# Patient Record
Sex: Female | Born: 2000 | Race: Black or African American | Hispanic: No | Marital: Single | Smoking: Never smoker
Health system: Southern US, Community
[De-identification: ages and names within clinical notes are randomized; demographics above are authoritative.]

## PROBLEM LIST (undated history)

## (undated) HISTORY — PX: WISDOM TOOTH EXTRACTION: SHX21

---
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
## 2022-01-10 ENCOUNTER — Other Ambulatory Visit: Payer: Self-pay

## 2022-01-10 ENCOUNTER — Emergency Department (HOSPITAL_COMMUNITY)
Admission: EM | Admit: 2022-01-10 | Discharge: 2022-01-10 | Disposition: A | Discharge status: Home / Self Care | Payer: BC Managed Care – PPO | Attending: Emergency Medicine | Admitting: Emergency Medicine

## 2022-01-10 ENCOUNTER — Encounter (HOSPITAL_COMMUNITY): Payer: Self-pay | Admitting: Emergency Medicine

## 2022-01-10 ENCOUNTER — Emergency Department (HOSPITAL_COMMUNITY): Payer: BC Managed Care – PPO

## 2022-01-10 DIAGNOSIS — Z7722 Contact with and (suspected) exposure to environmental tobacco smoke (acute) (chronic): Secondary | ICD-10-CM | POA: Diagnosis not present

## 2022-01-10 DIAGNOSIS — S43402A Unspecified sprain of left shoulder joint, initial encounter: Secondary | ICD-10-CM | POA: Diagnosis not present

## 2022-01-10 DIAGNOSIS — M25512 Pain in left shoulder: Secondary | ICD-10-CM | POA: Insufficient documentation

## 2022-01-10 DIAGNOSIS — M542 Cervicalgia: Secondary | ICD-10-CM | POA: Diagnosis not present

## 2022-01-10 DIAGNOSIS — R519 Headache, unspecified: Secondary | ICD-10-CM | POA: Diagnosis not present

## 2022-01-10 LAB — POC URINE PREG, ED: Preg Test, Ur: NEGATIVE

## 2022-01-10 MED ORDER — ACETAMINOPHEN 325 MG PO TABS: 650.0000 mg | ORAL_TABLET | Freq: Four times a day (QID) | ORAL | 0 refills | Status: AC | PRN

## 2022-01-10 MED ORDER — IBUPROFEN 600 MG PO TABS: 600.0000 mg | ORAL_TABLET | Freq: Four times a day (QID) | ORAL | 0 refills | Status: DC | PRN

## 2022-01-10 MED ORDER — METHOCARBAMOL 500 MG PO TABS: 1000.0000 mg | ORAL_TABLET | Freq: Two times a day (BID) | ORAL | 0 refills | Status: AC

## 2022-01-10 NOTE — Discharge Instructions (Signed)
It was a pleasure caring for you today in the emergency department. ° °Please return to the emergency department for any worsening or worrisome symptoms. ° ° °
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
## 2022-02-13 IMAGING — DX DG CHEST 2V
2 series · 2 of 2 positions shown · non-contrast
Comparison: None.

CLINICAL DATA: Cough and shortness of breath

EXAM:
CHEST - 2 VIEW

[chest pa]
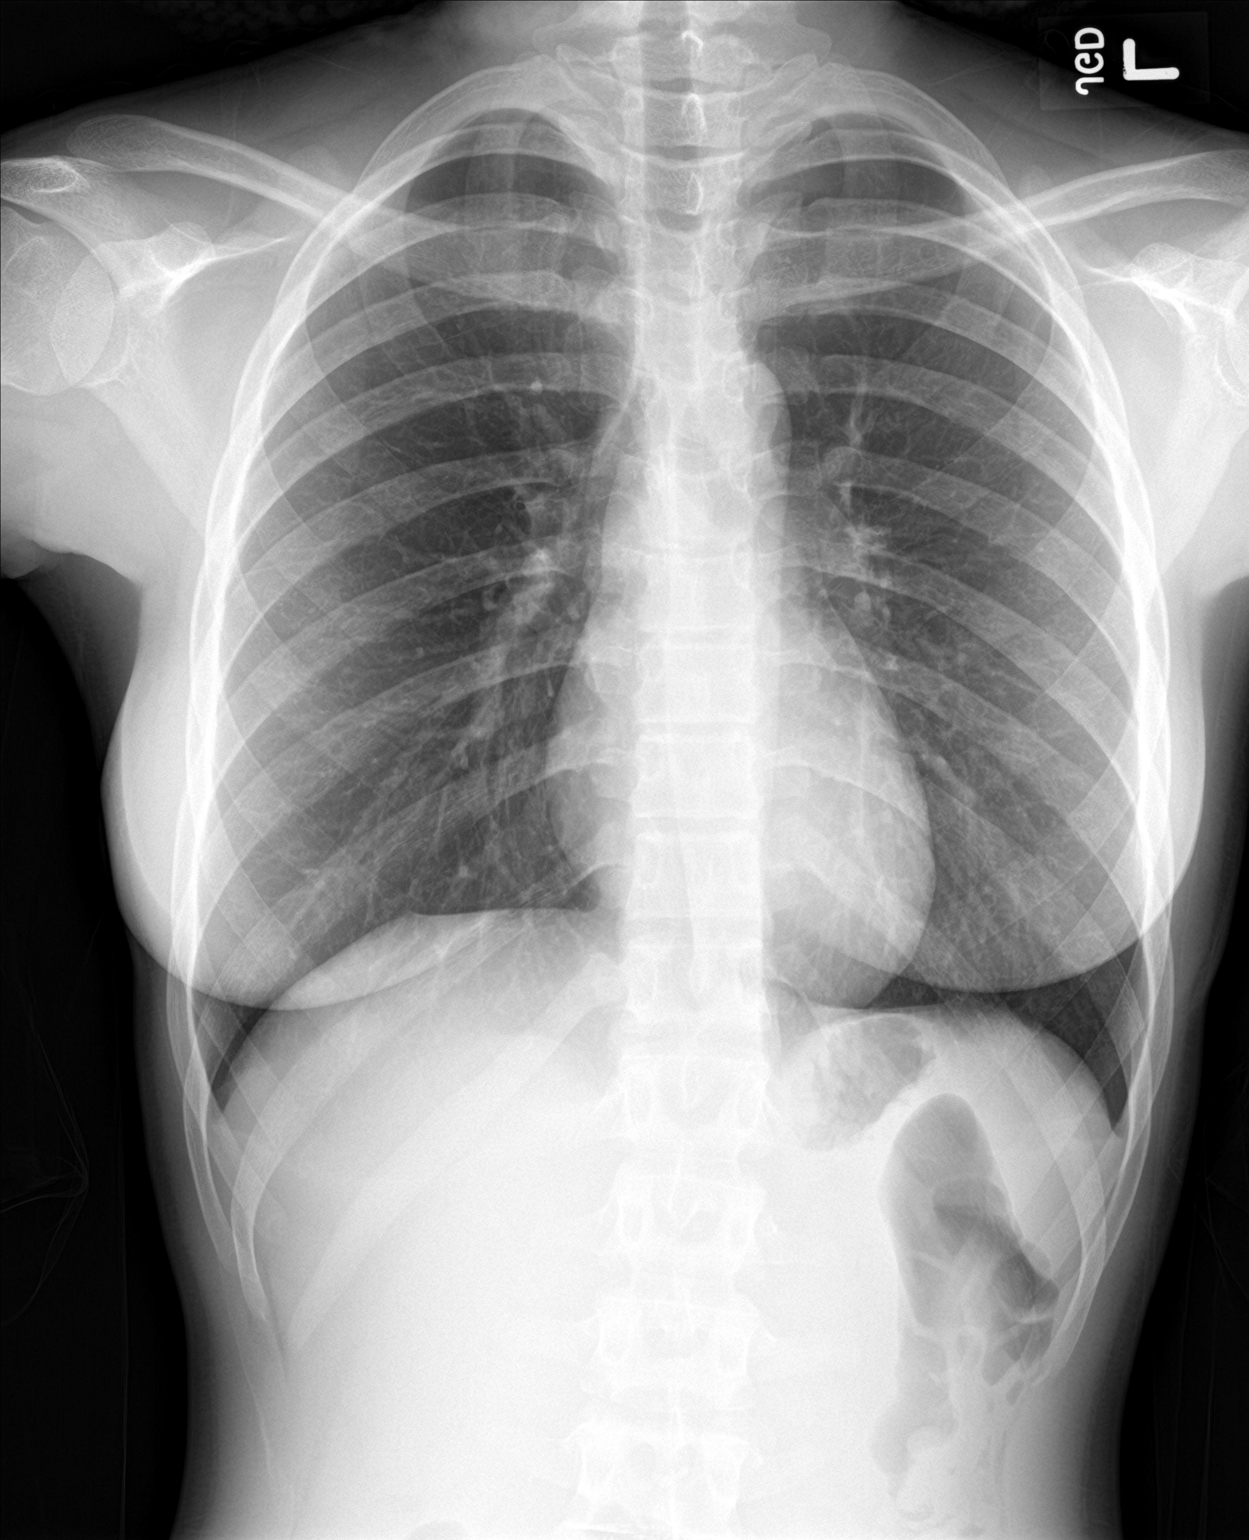

[chest lat]
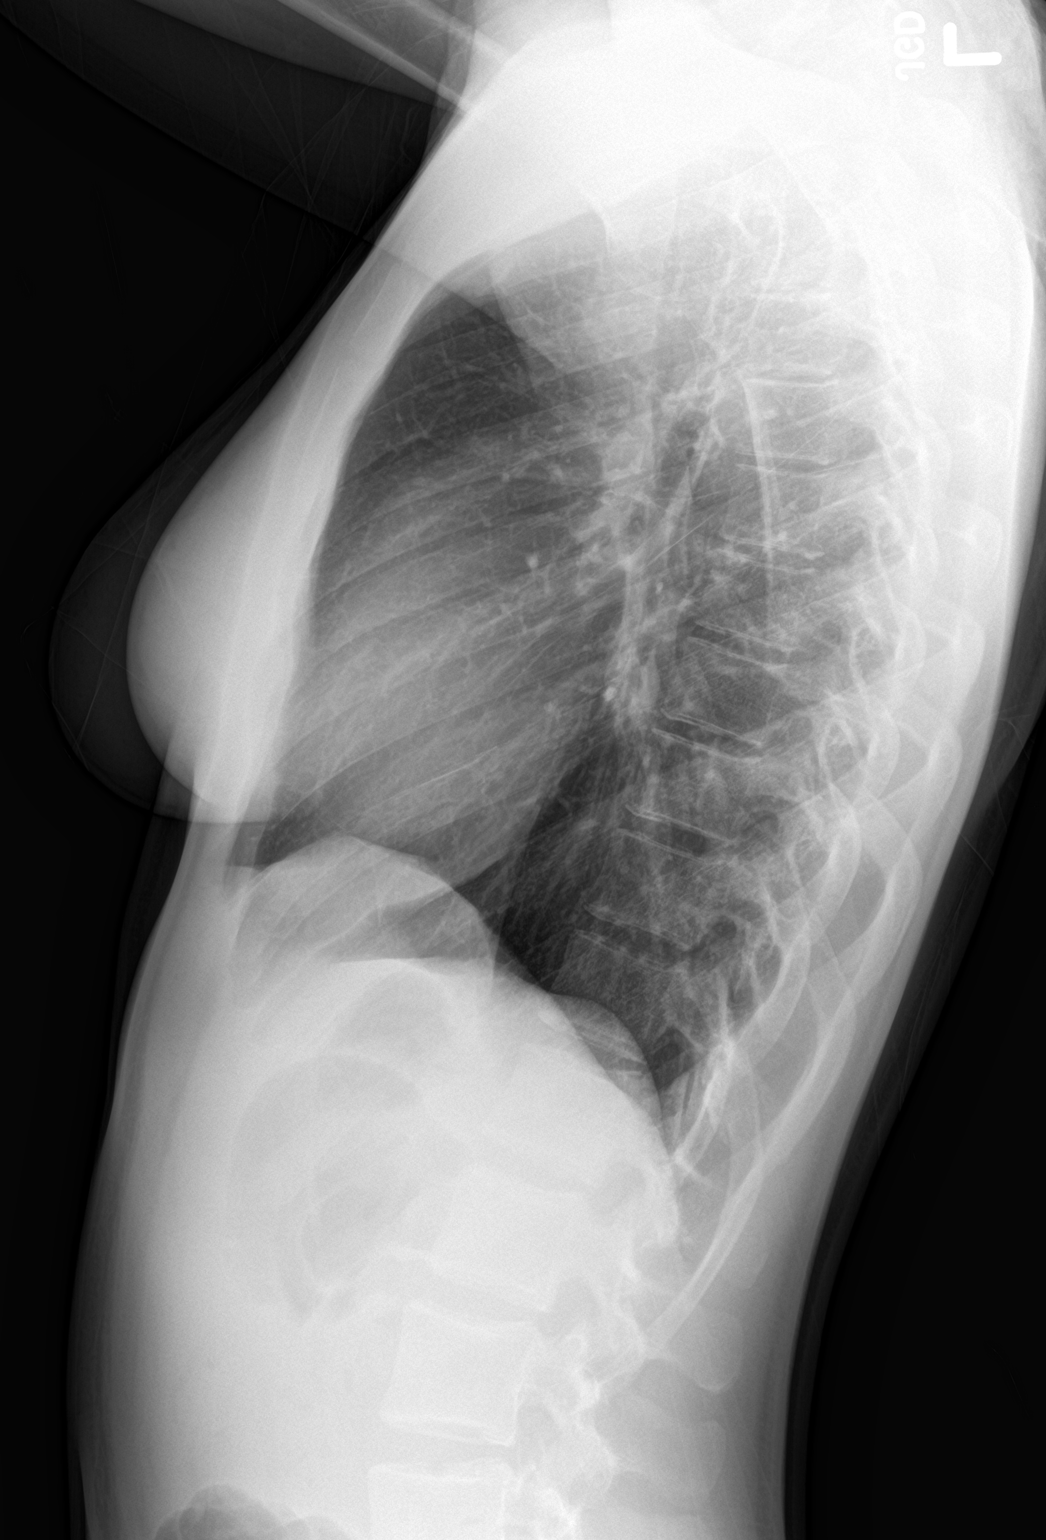

[2 of 2 positions shown; findings below may reference images not displayed]

FINDINGS: The heart size and mediastinal contours are within normal limits.
Both lungs are clear. The visualized skeletal structures are
unremarkable.
IMPRESSION: No active cardiopulmonary disease.

## 2022-03-03 ENCOUNTER — Ambulatory Visit (INDEPENDENT_AMBULATORY_CARE_PROVIDER_SITE_OTHER): Payer: BC Managed Care – PPO

## 2022-03-03 ENCOUNTER — Ambulatory Visit (HOSPITAL_COMMUNITY)
Admission: EM | Admit: 2022-03-03 | Discharge: 2022-03-03 | Disposition: A | Discharge status: Home / Self Care | Payer: BC Managed Care – PPO | Attending: Internal Medicine | Admitting: Internal Medicine

## 2022-03-03 ENCOUNTER — Encounter (HOSPITAL_COMMUNITY): Payer: Self-pay

## 2022-03-03 DIAGNOSIS — M545 Low back pain, unspecified: Secondary | ICD-10-CM | POA: Diagnosis not present

## 2022-03-03 DIAGNOSIS — S161XXA Strain of muscle, fascia and tendon at neck level, initial encounter: Secondary | ICD-10-CM | POA: Diagnosis not present

## 2022-03-03 DIAGNOSIS — M542 Cervicalgia: Secondary | ICD-10-CM | POA: Diagnosis not present

## 2022-03-03 LAB — POC URINE PREG, ED: Preg Test, Ur: NEGATIVE

## 2022-03-03 MED ORDER — METHOCARBAMOL 500 MG PO TABS: 500.0000 mg | ORAL_TABLET | Freq: Two times a day (BID) | ORAL | 0 refills | Status: DC
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
## 2022-03-03 NOTE — Discharge Instructions (Signed)
You have been evaluated in the today for your back and neck pain. Your pain is most likely muscle strain which will improve on its own with time.   You may continue taking ibuprofen 600 mg every 6 hours as needed for pain and inflammation.  You may also take Robaxin muscle relaxer every 12 hours as needed for muscle spasm.  Do not take this medication and drive or drink alcohol as it can make you sleepy.  Mainly use this medicine at nighttime as needed.  Apply heat and perform gentle range of motion exercises to the area of greatest pain to prevent muscle stiffness and provide further pain relief.   Follow-up with your primary care provider or return to urgent care if your symptoms do not improve in the next 3 to 4 days with medications and interventions recommended today.  If you develop any new or worsening symptoms, please return to urgent care.  If your symptoms are severe, please go to the emergency room.  I hope you feel better!
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
## 2022-07-31 ENCOUNTER — Emergency Department (HOSPITAL_COMMUNITY): Payer: BC Managed Care – PPO

## 2022-07-31 ENCOUNTER — Emergency Department (HOSPITAL_COMMUNITY)
Admission: EM | Admit: 2022-07-31 | Discharge: 2022-07-31 | Disposition: A | Discharge status: Home / Self Care | Payer: BC Managed Care – PPO | Attending: Emergency Medicine | Admitting: Emergency Medicine

## 2022-07-31 ENCOUNTER — Encounter (HOSPITAL_COMMUNITY): Payer: Self-pay

## 2022-07-31 ENCOUNTER — Other Ambulatory Visit: Payer: Self-pay

## 2022-07-31 DIAGNOSIS — M25512 Pain in left shoulder: Secondary | ICD-10-CM | POA: Diagnosis not present

## 2022-07-31 LAB — I-STAT BETA HCG BLOOD, ED (MC, WL, AP ONLY): I-stat hCG, quantitative: <5 m[IU]/mL (ref <5)

## 2022-07-31 MED ORDER — METHOCARBAMOL 500 MG PO TABS: 500.0000 mg | ORAL_TABLET | Freq: Two times a day (BID) | ORAL | 0 refills | Status: DC

## 2022-07-31 MED ORDER — IBUPROFEN 400 MG PO TABS: 400.0000 mg | ORAL_TABLET | Freq: Three times a day (TID) | ORAL | 0 refills | Status: AC

## 2022-07-31 NOTE — Discharge Instructions (Signed)
Your evaluation today has been largely reassuring.  But, it is important that you monitor your condition carefully, and do not hesitate to return to the ED if you develop new, or concerning changes in your condition.  Otherwise, please follow-up with our orthopedic physician for appropriate ongoing care.
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
## 2022-08-25 ENCOUNTER — Ambulatory Visit (HOSPITAL_COMMUNITY)
Admission: EM | Admit: 2022-08-25 | Discharge: 2022-08-25 | Disposition: A | Discharge status: Home / Self Care | Payer: BC Managed Care – PPO | Attending: Family Medicine | Admitting: Family Medicine

## 2022-08-25 ENCOUNTER — Other Ambulatory Visit: Payer: Self-pay

## 2022-08-25 ENCOUNTER — Encounter (HOSPITAL_COMMUNITY): Payer: Self-pay | Admitting: *Deleted

## 2022-08-25 DIAGNOSIS — K529 Noninfective gastroenteritis and colitis, unspecified: Secondary | ICD-10-CM

## 2022-08-25 MED ORDER — ONDANSETRON 4 MG PO TBDP: 4.0000 mg | ORAL_TABLET | Freq: Three times a day (TID) | ORAL | 0 refills | Status: DC | PRN

## 2022-08-25 MED ORDER — FAMOTIDINE 40 MG PO TABS: 40.0000 mg | ORAL_TABLET | Freq: Every day | ORAL | 0 refills | Status: AC

## 2022-08-25 NOTE — ED Provider Notes (Signed)
[facility]    CSN: [id number] Arrival date & time: 08/25/22  1601      History   Chief Complaint Chief Complaint  Patient presents with   Headache   Emesis   Diarrhea    HPI Krista Bailey is a 22 y.o. female.   HPI Patient here for work note. She reports over the last 6 days experiencing nausea, vomiting, and diarrhea. She has missed work due to symptoms. Reports no overt abdominal pain although endorses "feeling sick on the stomach". She ate today and reports tolerated food. She denies to this writer that she vomited today, although endorses ongoing nausea.Patient is a chronic cannabis user and patient has had prior encounters for similar symptoms. She has not taken any medication for her symptoms.  History reviewed. No pertinent past medical history.  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 No pertinent surgical history.  OB History   No obstetric history on file.      Home Medications    Prior to Admission medications   Medication Sig Start Date End Date Taking? Authorizing Provider  famotidine (PEPCID) 40 MG tablet Take 1 tablet (40 mg total) by mouth daily. 08/25/22  Yes Bing Neighbors, NP  ondansetron (ZOFRAN-ODT) 4 MG disintegrating tablet Take 1 tablet (4 mg total) by mouth every 8 (eight) hours as needed for nausea or vomiting. 08/25/22  Yes Bing Neighbors, NP  acetaminophen (TYLENOL) 325 MG tablet Take 2 tablets (650 mg total) by mouth every 6 (six) hours as needed. 01/10/22   Sloan Leiter, DO  ibuprofen (ADVIL) 600 MG tablet Take 1 tablet (600 mg total) by mouth every 6 (six) hours as needed. 01/10/22   Sloan Leiter, DO  methocarbamol (ROBAXIN) 500 MG tablet Take 1 tablet  (500 mg total) by mouth 2 (two) times daily. 07/31/22   Gerhard Munch, MD    Family History Family History  Problem Relation Age of Onset   Healthy Mother    Healthy Father     Social History Social History   Tobacco Use   Smoking status: Never    Passive exposure: Yes   Smokeless tobacco: Never  Substance Use Topics   Alcohol use: Never   Drug use: Not Currently    Types: Marijuana     Allergies   Patient has no known allergies.   Review of Systems Review of Systems Pertinent negatives listed in HPI   Physical Exam Triage Vital Signs ED Triage Vitals  Enc Vitals Group     BP      Pulse      Resp      Temp      Temp src      SpO2      Weight      Height      Head Circumference      Peak Flow      Pain Score      Pain Loc      Pain Edu?      Excl. in GC?    No data found.  Updated Vital Signs BP 108/67   Pulse 67   Temp 98.7 F (37.1 C)   Resp 18   LMP 05/29/2022 (Approximate) Comment: Depo shot  SpO2 98%   Visual Acuity Right Eye Distance:   Left Eye Distance:   Bilateral Distance:    Right Eye Near:   Left Eye Near:    Bilateral Near:     Physical Exam General appearance: Alert, well developed, well nourished, cooperative  Head: Normocephalic, without obvious abnormality, atraumatic Heart: Rate and rhythm normal.  Respiratory: Respirations even and unlabored, normal respiratory rate Abdomen: BS +, no distention, no rebound tenderness, or no mass Extremities: No gross deformities Neurologic:  Skin: Skin color, texture, turgor normal. No rashes seen  Psych: Appropriate mood and affect.  UC Treatments / Results  Labs (all labs ordered are listed, but only abnormal results are displayed) Labs Reviewed - No data to display   EKG   Radiology No results found.  Procedures Procedures (including critical care time)  Medications Ordered in UC Medications - No data to display  Initial Impression / Assessment and Plan / UC  Course  I have reviewed the triage vital signs and the nursing notes.  Pertinent labs & imaging results that were available during my care of the patient were reviewed by me and considered in my medical decision making (see chart for details).    Low suspicion for acute abdomen, patient is grossly well-appearing. Symptom management only and bland diet. Strict ED precautions given if symptoms worsen.  Work note provided. Final Clinical Impressions(s) / UC Diagnoses     Discharge Instructions   None    ED Prescriptions     Medication Sig Dispense Auth. Provider   ondansetron (ZOFRAN-ODT) 4 MG disintegrating tablet Take 1 tablet (4 mg total) by mouth every 8 (eight) hours as needed for nausea or vomiting. 20 tablet Bing Neighbors, NP   famotidine (PEPCID) 40 MG tablet Take 1 tablet (40 mg total) by mouth daily. 30 tablet Bing Neighbors, NP      PDMP not reviewed this encounter.   Bing Neighbors, NP 08/29/22 2222

## 2022-08-25 NOTE — ED Triage Notes (Addendum)
Pt reports she had food poisioning  with HA ,vomiting and diarrhea last WEd. Pt has had diarrhea and vomiting while waiting for a room today. Pt needs a work note.

## 2022-09-03 ENCOUNTER — Ambulatory Visit (HOSPITAL_COMMUNITY)
Admission: EM | Admit: 2022-09-03 | Discharge: 2022-09-03 | Disposition: A | Discharge status: Home / Self Care | Payer: BC Managed Care – PPO | Attending: Nurse Practitioner | Admitting: Nurse Practitioner

## 2022-09-03 ENCOUNTER — Encounter (HOSPITAL_COMMUNITY): Payer: Self-pay | Admitting: Emergency Medicine

## 2022-09-03 DIAGNOSIS — R220 Localized swelling, mass and lump, head: Secondary | ICD-10-CM | POA: Diagnosis not present

## 2022-09-03 DIAGNOSIS — L03211 Cellulitis of face: Secondary | ICD-10-CM

## 2022-09-03 DIAGNOSIS — Z789 Other specified health status: Secondary | ICD-10-CM

## 2022-09-03 MED ORDER — DOXYCYCLINE HYCLATE 100 MG PO CAPS: 100.0000 mg | ORAL_CAPSULE | Freq: Two times a day (BID) | ORAL | 0 refills | Status: AC

## 2022-09-03 NOTE — ED Triage Notes (Signed)
Pt presents with right eye swelling. Pt recently got new facial piecings placed and now has swelling around the upper and lower eye lid.

## 2022-09-03 NOTE — ED Provider Notes (Signed)
[facility]    CSN: [id number] Arrival date & time: 09/03/22  7829      History   Chief Complaint Chief Complaint  Patient presents with   Facial Swelling    HPI Krista Bailey is a 22 y.o. female.   HPI She is in today for left eye swelling.  She reports that she received a right upper eyelid.  She feels weeks.  She had some initial swelling that resolved.  She recently had a piercing to her right cheek bone.  She reports that she is using antiseptic cream along with saline she reports that she has had multiple.  Sounds completed this site without any history of any adverse reactions.  She is unsure of the type of actual metals used.  She is unsure if it is gold, silver, or nickel.  She denies any actual drainage from the piercing sites.  She is having some watery drainage from her eye.  She does wear eyeglasses.  She reports she has not been seen by her care specialist in a while.  She is having difficulty sitting due to the swelling. History reviewed. No pertinent past medical history.  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 No pertinent surgical history.  OB History   No obstetric history on file.      Home Medications    Prior to Admission medications   Medication Sig Start Date End Date Taking? Authorizing Provider  doxycycline (VIBRAMYCIN) 100 MG capsule Take 1 capsule (100 mg total) by mouth 2 (two) times daily for 10 days. 09/03/22 09/13/22 Yes Barbette Merino, NP  acetaminophen (TYLENOL) 325 MG tablet Take 2 tablets (650 mg total) by mouth every 6 (six) hours as needed. 01/10/22   Sloan Leiter, DO  famotidine (PEPCID) 40 MG tablet Take 1 tablet (40 mg total) by mouth daily. 08/25/22   Bing Neighbors, NP  ibuprofen (ADVIL) 600 MG tablet Take 1 tablet (600 mg total) by mouth every 6 (six) hours as needed. 01/10/22   Sloan Leiter, DO  methocarbamol (ROBAXIN) 500 MG tablet Take 1 tablet (500 mg total) by mouth 2 (two) times daily. 07/31/22   Gerhard Munch, MD  ondansetron (ZOFRAN-ODT) 4 MG disintegrating tablet Take 1 tablet (4 mg total) by mouth every 8 (eight) hours as needed for nausea or vomiting. 08/25/22   Bing Neighbors, NP    Family History Family History  Problem Relation Age of Onset   Healthy Mother    Healthy Father     Social History Social History   Tobacco Use   Smoking status: Never    Passive exposure: Yes   Smokeless tobacco: Never  Substance Use Topics   Alcohol use: Never   Drug use: Not Currently    Types: Marijuana     Allergies   Patient has no known allergies.   Review of Systems Review of Systems   Physical Exam Triage Vital Signs ED Triage Vitals  Enc Vitals Group     BP 09/03/22 0848 108/73     Pulse Rate 09/03/22 0848 90     Resp 09/03/22 0848 18     Temp 09/03/22 0848 98.3 F (36.8 C)     Temp Source 09/03/22 0848  Oral     SpO2 09/03/22 0848 98 %     Weight --      Height --      Head Circumference --      Peak Flow --      Pain Score 09/03/22 0846 4     Pain Loc --      Pain Edu? --      Excl. in GC? --    No data found.  Updated Vital Signs BP 108/73 (BP Location: Right Arm)   Pulse 90   Temp 98.3 F (36.8 C) (Oral)   Resp 18   LMP 05/29/2022 (Approximate) Comment: Depo shot  SpO2 98%   Visual Acuity Right Eye Distance:   Left Eye Distance:   Bilateral Distance:    Right Eye Near:   Left Eye Near:    Bilateral Near:     Physical Exam Constitutional:      Appearance: She is normal weight.  HENT:     Head: Normocephalic and atraumatic.     Nose: Nose normal.     Mouth/Throat:     Mouth: Mucous membranes are moist.  Eyes:     Pupils: Pupils are equal, round, and reactive to light.    Cardiovascular:     Rate and Rhythm: Normal rate.  Pulmonary:     Effort: Pulmonary effort is normal.  Musculoskeletal:        General: Normal range of motion.  Skin:    General: Skin is warm.     Capillary Refill: Capillary refill takes less than 2 seconds.  Neurological:     General: No focal deficit present.     Mental Status: She is alert and oriented to person, place, and time.  Psychiatric:        Mood and Affect: Mood normal.      UC Treatments / Results  Labs (all labs ordered are listed, but only abnormal results are displayed) Labs Reviewed - No data to display  EKG   Radiology No results found.  Procedures Procedures (including critical care time)  Medications Ordered in UC Medications - No data to display  Initial Impression / Assessment and Plan / UC Course  I have reviewed the triage vital signs and the nursing notes.  Pertinent labs & imaging results that were available during my care of the patient were reviewed by me and considered in my medical decision making (see chart for details).     Facial swelling Final Clinical Impressions(s) / UC Diagnoses   Final diagnoses:  Right facial swelling  Body piercing  Cellulitis of face  Declined in office IM injection of steroids and  antibiotics.   Discharge Instructions      You have been diagnosed with right facial cellulitis related by new piercing.  You have been prescribed antibiotic, doxycycline 100 mg twice daily for10 days.  You are to complete the entire prescription despite the symptoms improving.  You have also been recommended to use antihistamine, Claritin or Benadryl to help with antihistamines response.  You can also use Visine eyedrops to help with the dry eye symptoms that you are experiencing.  You can use ice to the area to help with the swelling.  If your vision changes the recommendation is to go to the eye care specialist for reevaluation.     ED Prescriptions      Medication Sig Dispense Auth. Provider   doxycycline (VIBRAMYCIN) 100 MG capsule Take 1 capsule (100 mg total) by mouth 2 (  two) times daily for 10 days. 20 capsule Barbette Merino, NP      PDMP not reviewed this encounter.   Thad Ranger [place], NP 09/03/22 1049

## 2022-09-03 NOTE — Discharge Instructions (Addendum)
You have been diagnosed with right facial cellulitis related by new piercing.  You have been prescribed antibiotic, doxycycline 100 mg twice daily for10 days.  You are to complete the entire prescription despite the symptoms improving.  You have also been recommended to use antihistamine, Claritin or Benadryl to help with antihistamines response.  You can also use Visine eyedrops to help with the dry eye symptoms that you are experiencing.  You can use ice to the area to help with the swelling.  If your vision changes the recommendation is to go to the eye care specialist for reevaluation.

## 2023-08-08 ENCOUNTER — Ambulatory Visit (HOSPITAL_COMMUNITY)
Admission: EM | Admit: 2023-08-08 | Discharge: 2023-08-08 | Disposition: A | Discharge status: Home / Self Care | Attending: Internal Medicine | Admitting: Internal Medicine

## 2023-08-08 ENCOUNTER — Encounter (HOSPITAL_COMMUNITY): Payer: Self-pay | Admitting: Internal Medicine

## 2023-08-08 ENCOUNTER — Other Ambulatory Visit: Payer: Self-pay

## 2023-08-08 DIAGNOSIS — R634 Abnormal weight loss: Secondary | ICD-10-CM | POA: Diagnosis not present

## 2023-08-08 DIAGNOSIS — R197 Diarrhea, unspecified: Secondary | ICD-10-CM

## 2023-08-08 DIAGNOSIS — R110 Nausea: Secondary | ICD-10-CM | POA: Diagnosis not present

## 2023-08-08 DIAGNOSIS — R1030 Lower abdominal pain, unspecified: Secondary | ICD-10-CM | POA: Diagnosis not present

## 2023-08-08 LAB — POCT URINALYSIS DIP (MANUAL ENTRY)
Bilirubin, UA: NEGATIVE
Blood, UA: NEGATIVE
Glucose, UA: NEGATIVE mg/dL
Ketones, POC UA: NEGATIVE mg/dL
Nitrite, UA: NEGATIVE
Protein Ur, POC: NEGATIVE mg/dL
Spec Grav, UA: 1.02 (ref 1.010–1.025)
Urobilinogen, UA: 0.2 U/dL
pH, UA: 8.5 — AB (ref 5.0–8.0)

## 2023-08-08 LAB — POCT FASTING CBG KUC MANUAL ENTRY: POCT Glucose (KUC): 96 mg/dL (ref 70–99)

## 2023-08-08 MED ORDER — ONDANSETRON 4 MG PO TBDP: 4.0000 mg | ORAL_TABLET | Freq: Three times a day (TID) | ORAL | 0 refills | Status: AC | PRN

## 2023-08-08 MED ORDER — ONDANSETRON 4 MG PO TBDP
4.0000 mg | ORAL_TABLET | Freq: Once | ORAL | Status: AC
  Administered 2023-08-08: 4 mg via ORAL

## 2023-08-08 MED ORDER — NITROFURANTOIN MONOHYD MACRO 100 MG PO CAPS: 100.0000 mg | ORAL_CAPSULE | Freq: Two times a day (BID) | ORAL | 0 refills | Status: DC

## 2023-08-08 MED ORDER — PROMETHAZINE HCL 25 MG PO TABS: 25.0000 mg | ORAL_TABLET | Freq: Four times a day (QID) | ORAL | 0 refills | Status: AC | PRN

## 2023-08-08 MED ORDER — ONDANSETRON 4 MG PO TBDP
ORAL_TABLET | ORAL | Status: AC
  Filled 2023-08-08: qty 1

## 2023-08-08 NOTE — ED Triage Notes (Signed)
 Pt states she has been nauseous for a couple of weeks. Pt states body aches and abd pain for a couple of days. Pt states she has been having diarrhea 2 days as well but it got worse yesterday. Pt has taken OTC zinc, iron, and black seed oil.

## 2023-08-08 NOTE — ED Provider Notes (Addendum)
 [facility]    CSN: [id number] Arrival date & time: 08/08/23  1217      History   Chief Complaint Chief Complaint  Patient presents with   Abdominal Pain   Nausea   Diarrhea   Generalized Body Aches    HPI Krista Bailey is a 23 y.o. female.   23 year old female who presents urgent care with complaints of nausea, lower abdominal pain, body aches, diarrhea.  She reports that this started with nausea that has now been going on for 2 weeks.  She reports that the nausea can happen when she is driving in the car, when she is walking, when she is just sitting.  This is caused her to have a decreased appetite and she is only eating about once daily.  She denies any vomiting.  She reports that she has lost about 7 pounds in 1 week reporting that she was 125 pounds a week ago and now 118.  3 days ago she started developing diarrhea that is mucus appearing.  She is also having some frequency and urgency with urination but denies any dysuria or hematuria.  She denies any fevers or chills.  She has no concerns for pregnancy and is on Depo.  She had a Pap smear on April 2 that was normal.  She does smoke marijuana but has not been smoking any in the last several weeks.  She has a family history that is positive for diabetes on her mom side.  She is not having any cough, congestion, chest pain, shortness of breath, bowel or bladder incontinence.  She has not taken any over-the-counter medication for her symptoms.    Abdominal Pain Associated symptoms: diarrhea and nausea   Associated symptoms: no chest pain, no chills, no cough, no dysuria, no fever, no hematuria, no shortness of breath, no sore throat and no vomiting   Diarrhea Associated symptoms: abdominal pain (lower abdomen)   Associated symptoms: no arthralgias, no chills, no fever and no vomiting     History reviewed. No pertinent past medical history.  Patient Active Problem List   Diagnosis Date Noted   Suicide attempt by  drug overdose (HCC) 12/14/2019   History of self injurious behavior 12/14/2019   Cannabis use disorder, mild, abuse 12/14/2019   Severe major depression, single episode, without psychotic features (HCC) 12/13/2019    History reviewed. No pertinent surgical history.  OB History   No obstetric history on file.      Home Medications    Prior to Admission medications   Medication Sig Start Date End Date Taking? Authorizing Provider  nitrofurantoin , macrocrystal-monohydrate, (MACROBID ) 100 MG capsule Take 1 capsule (100 mg total) by mouth 2 (two) times daily. 08/08/23  Yes Flint Hakeem A, PA-C  ondansetron  (ZOFRAN -ODT) 4 MG disintegrating tablet Take 1 tablet (4 mg total) by mouth every 8 (eight) hours as needed for nausea or vomiting. 08/08/23  Yes Kaelah Hayashi A, PA-C  promethazine  (PHENERGAN ) 25 MG tablet Take 1 tablet (25 mg total) by mouth every 6 (six) hours as needed for nausea or vomiting. 08/08/23  Yes Nickolas Chalfin A, PA-C  acetaminophen  (TYLENOL ) 325 MG tablet Take 2 tablets (650 mg total) by mouth every 6 (six) hours as needed. 01/10/22   Teddi Favors, DO  famotidine  (PEPCID ) 40 MG tablet Take 1 tablet (40 mg total) by mouth daily. 08/25/22   Buena Carmine, NP  ibuprofen  (ADVIL ) 600 MG tablet Take 1 tablet (600 mg total) by mouth every 6 (six) hours  as needed. 01/10/22   Teddi Favors, DO  methocarbamol  (ROBAXIN ) 500 MG tablet Take 1 tablet (500 mg total) by mouth 2 (two) times daily. 07/31/22   Dorenda Gandy, MD    Family History Family History  Problem Relation Age of Onset   Healthy Mother    Healthy Father     Social History Social History   Tobacco Use   Smoking status: Never    Passive exposure: Yes   Smokeless tobacco: Never  Substance Use Topics   Alcohol use: Never   Drug use: Not Currently    Types: Marijuana     Allergies   Patient has no known allergies.   Review of Systems Review of Systems  Constitutional:  Negative for chills  and fever.  HENT:  Negative for ear pain and sore throat.   Eyes:  Negative for pain and visual disturbance.  Respiratory:  Negative for cough and shortness of breath.   Cardiovascular:  Negative for chest pain and palpitations.  Gastrointestinal:  Positive for abdominal pain (lower abdomen), diarrhea and nausea. Negative for vomiting.  Genitourinary:  Positive for frequency and urgency. Negative for dysuria and hematuria.  Musculoskeletal:  Negative for arthralgias and back pain.       Body aches  Skin:  Negative for color change and rash.  Neurological:  Negative for seizures and syncope.  All other systems reviewed and are negative.    Physical Exam Triage Vital Signs ED Triage Vitals  Encounter Vitals Group     BP 08/08/23 1229 114/74     Systolic BP Percentile --      Diastolic BP Percentile --      Pulse Rate 08/08/23 1229 68     Resp 08/08/23 1229 20     Temp 08/08/23 1229 98.2 F (36.8 C)     Temp Source 08/08/23 1229 Oral     SpO2 08/08/23 1229 98 %     Weight --      Height --      Head Circumference --      Peak Flow --      Pain Score 08/08/23 1226 7     Pain Loc --      Pain Education --      Exclude from Growth Chart --    No data found.  Updated Vital Signs BP 114/74 (BP Location: Right Arm)   Pulse 68   Temp 98.2 F (36.8 C) (Oral)   Resp 20   SpO2 98%   Visual Acuity Right Eye Distance:   Left Eye Distance:   Bilateral Distance:    Right Eye Near:   Left Eye Near:    Bilateral Near:     Physical Exam Vitals and nursing note reviewed.  Constitutional:      General: She is not in acute distress.    Appearance: She is well-developed.  HENT:     Head: Normocephalic and atraumatic.  Eyes:     Conjunctiva/sclera: Conjunctivae normal.  Cardiovascular:     Rate and Rhythm: Normal rate and regular rhythm.     Heart sounds: No murmur heard. Pulmonary:     Effort: Pulmonary effort is normal. No respiratory distress.     Breath sounds: Normal  breath sounds.  Abdominal:     General: Abdomen is flat. Bowel sounds are increased. There is no distension.     Palpations: Abdomen is soft. There is no mass.     Tenderness: There is abdominal tenderness (Mild) in the  suprapubic area. There is no guarding or rebound. Negative signs include McBurney's sign.     Hernia: There is no hernia in the umbilical area or ventral area.  Musculoskeletal:        General: No swelling.     Cervical back: Neck supple.  Skin:    General: Skin is warm and dry.     Capillary Refill: Capillary refill takes less than 2 seconds.  Neurological:     Mental Status: She is alert.  Psychiatric:        Mood and Affect: Mood normal.      UC Treatments / Results  Labs (all labs ordered are listed, but only abnormal results are displayed) Labs Reviewed  POCT URINALYSIS DIP (MANUAL ENTRY) - Abnormal; Notable for the following components:      Result Value   Clarity, UA hazy (*)    pH, UA 8.5 (*)    Leukocytes, UA Small (1+) (*)    All other components within normal limits  POCT FASTING CBG K[facility] MANUAL ENTRY - Normal    EKG   Radiology No results found.  Procedures Procedures (including critical care time)  Medications Ordered in UC Medications  ondansetron  (ZOFRAN -ODT) disintegrating tablet 4 mg (4 mg Oral Given 08/08/23 1248)    Initial Impression / Assessment and Plan / UC Course  I have reviewed the triage vital signs and the nursing notes.  Pertinent labs & imaging results that were available during my care of the patient were reviewed by me and considered in my medical decision making (see chart for details).     Nausea  Lower abdominal pain  Diarrhea, unspecified type  Loss of weight   Nausea for 2 weeks with loss of appetite, weight loss, generalized lower abdominal pain and new onset of diarrhea.  Blood sugar checked today was 96 which is within normal limits.  Abdominal exam does not reveal any concerning findings that would  indicate a need for urgent evaluation at the emergency room however given the multitude of abdominal symptoms as well as the weight loss, we recommend more emergent follow-up with primary care and we have scheduled an appointment for you on Thursday.  Please make sure to keep this appointment.  We can treat the nausea with Zofran  4 mg every 8 hours as needed for nausea. Also can try promethazine  12.5-25 mg every 6 hours as needed for nausea but this medication can make you drowsy so use caution.  If the symptoms worsen significantly before Thursday then recommend going to the emergency room for further evaluation.   Urinalysis done today shows some trace leukocytes but otherwise was normal. We can treat for a UTI given the frequency and urgency and send the urine off for a culture.  If the culture is negative for bacteria we will discontinue the antibiotics.  This may be exacerbating your abdominal symptoms but is unlikely to be the main cause. Macrobid  100 mg twice daily for 5 days. This is an antibiotic. Take it with food.   Final Clinical Impressions(s) / UC Diagnoses   Final diagnoses:  Nausea  Lower abdominal pain  Diarrhea, unspecified type  Loss of weight     Discharge Instructions      Nausea for 2 weeks with loss of appetite, weight loss, generalized lower abdominal pain and new onset of diarrhea.  Blood sugar checked today was 96 which is within normal limits.  Abdominal exam does not reveal any concerning findings that would indicate a need for  urgent evaluation at the emergency room however given the multitude of abdominal symptoms as well as the weight loss, we recommend more emergent follow-up with primary care and we have scheduled an appointment for you on Thursday.  Please make sure to keep this appointment.  We can treat the nausea with Zofran  4 mg every 8 hours as needed for nausea. Dose given at 1:00 pm, next dose would be around 9:00 pm. Also can try promethazine  12.5-25 mg  every 6 hours as needed for nausea but this medication can make you drowsy so use caution.  If the symptoms worsen significantly before Thursday then recommend going to the emergency room for further evaluation.   Urinalysis done today shows some trace leukocytes but otherwise was normal. We can treat for a UTI given the frequency and urgency and send the urine off for a culture.  If the culture is negative for bacteria we will discontinue the antibiotics.  This may be exacerbating your abdominal symptoms but is unlikely to be the main cause. Macrobid  100 mg twice daily for 5 days. This is an antibiotic. Take it with food.    ED Prescriptions     Medication Sig Dispense Auth. Provider   ondansetron  (ZOFRAN -ODT) 4 MG disintegrating tablet Take 1 tablet (4 mg total) by mouth every 8 (eight) hours as needed for nausea or vomiting. 20 tablet Josilynn Losh A, PA-C   nitrofurantoin , macrocrystal-monohydrate, (MACROBID ) 100 MG capsule Take 1 capsule (100 mg total) by mouth 2 (two) times daily. 10 capsule Lorenzo Romberg A, PA-C   promethazine  (PHENERGAN ) 25 MG tablet Take 1 tablet (25 mg total) by mouth every 6 (six) hours as needed for nausea or vomiting. 20 tablet Kreg Pesa, [place]      PDMP not reviewed this encounter.   Kreg Pesa, PA-C 08/08/23 1326    Kreg Pesa, PA-C 08/08/23 1326

## 2023-08-08 NOTE — Discharge Instructions (Addendum)
 Nausea for 2 weeks with loss of appetite, weight loss, generalized lower abdominal pain and new onset of diarrhea.  Blood sugar checked today was 96 which is within normal limits.  Abdominal exam does not reveal any concerning findings that would indicate a need for urgent evaluation at the emergency room however given the multitude of abdominal symptoms as well as the weight loss, we recommend more emergent follow-up with primary care and we have scheduled an appointment for you on Thursday.  Please make sure to keep this appointment.  We can treat the nausea with Zofran  4 mg every 8 hours as needed for nausea. Dose given at 1:00 pm, next dose would be around 9:00 pm. Also can try promethazine  12.5-25 mg every 6 hours as needed for nausea but this medication can make you drowsy so use caution.  If the symptoms worsen significantly before Thursday then recommend going to the emergency room for further evaluation.   Urinalysis done today shows some trace leukocytes but otherwise was normal. We can treat for a UTI given the frequency and urgency and send the urine off for a culture.  If the culture is negative for bacteria we will discontinue the antibiotics.  This may be exacerbating your abdominal symptoms but is unlikely to be the main cause. Macrobid  100 mg twice daily for 5 days. This is an antibiotic. Take it with food.

## 2023-08-11 ENCOUNTER — Emergency Department (HOSPITAL_COMMUNITY)
Admission: EM | Admit: 2023-08-11 | Discharge: 2023-08-11 | Disposition: A | Discharge status: Home / Self Care | Attending: Emergency Medicine | Admitting: Emergency Medicine

## 2023-08-11 ENCOUNTER — Emergency Department (HOSPITAL_COMMUNITY)

## 2023-08-11 ENCOUNTER — Other Ambulatory Visit: Payer: Self-pay

## 2023-08-11 ENCOUNTER — Encounter (HOSPITAL_COMMUNITY): Payer: Self-pay

## 2023-08-11 DIAGNOSIS — R109 Unspecified abdominal pain: Secondary | ICD-10-CM | POA: Diagnosis not present

## 2023-08-11 DIAGNOSIS — R1084 Generalized abdominal pain: Secondary | ICD-10-CM | POA: Insufficient documentation

## 2023-08-11 DIAGNOSIS — N200 Calculus of kidney: Secondary | ICD-10-CM | POA: Diagnosis not present

## 2023-08-11 LAB — CBC
HCT: 41.8 % (ref 36.0–46.0)
Hemoglobin: 13.2 g/dL (ref 12.0–15.0)
MCH: 28.5 pg (ref 26.0–34.0)
MCHC: 31.6 g/dL (ref 30.0–36.0)
MCV: 90.3 fL (ref 80.0–100.0)
Platelets: 223 10*3/uL (ref 150–400)
RBC: 4.63 MIL/uL (ref 3.87–5.11)
RDW: 13 % (ref 11.5–15.5)
WBC: 4 10*3/uL (ref 4.0–10.5)
nRBC: 0 % (ref 0.0–0.2)

## 2023-08-11 LAB — COMPREHENSIVE METABOLIC PANEL WITH GFR
ALT: 13 U/L (ref 0–44)
AST: 20 U/L (ref 15–41)
Albumin: 3.2 g/dL — ABNORMAL LOW (ref 3.5–5.0)
Alkaline Phosphatase: 57 U/L (ref 38–126)
Anion gap: 4 — ABNORMAL LOW (ref 5–15)
BUN: 5 mg/dL — ABNORMAL LOW (ref 6–20)
CO2: 24 mmol/L (ref 22–32)
Calcium: 10.8 mg/dL — ABNORMAL HIGH (ref 8.9–10.3)
Chloride: 111 mmol/L (ref 98–111)
Creatinine, Ser: 0.91 mg/dL (ref 0.44–1.00)
GFR, Estimated: >60 mL/min (ref >60)
Glucose, Bld: 72 mg/dL (ref 70–99)
Potassium: 4 mmol/L (ref 3.5–5.1)
Sodium: 139 mmol/L (ref 135–145)
Total Bilirubin: 0.6 mg/dL (ref 0.0–1.2)
Total Protein: 5.7 g/dL — ABNORMAL LOW (ref 6.5–8.1)

## 2023-08-11 LAB — URINALYSIS, ROUTINE W REFLEX MICROSCOPIC
Bilirubin Urine: NEGATIVE
Glucose, UA: NEGATIVE mg/dL
Hgb urine dipstick: NEGATIVE
Ketones, ur: NEGATIVE mg/dL
Nitrite: NEGATIVE
Protein, ur: NEGATIVE mg/dL
Specific Gravity, Urine: >1.046 — ABNORMAL HIGH (ref 1.005–1.030)
pH: 7 (ref 5.0–8.0)

## 2023-08-11 LAB — LIPASE, BLOOD: Lipase: 30 U/L (ref 11–51)

## 2023-08-11 LAB — RAPID URINE DRUG SCREEN, HOSP PERFORMED
Amphetamines: NOT DETECTED
Barbiturates: NOT DETECTED
Benzodiazepines: NOT DETECTED
Cocaine: NOT DETECTED
Opiates: NOT DETECTED
Tetrahydrocannabinol: POSITIVE — AB

## 2023-08-11 LAB — HCG, SERUM, QUALITATIVE: Preg, Serum: NEGATIVE

## 2023-08-11 MED ORDER — IOHEXOL 350 MG/ML SOLN
75.0000 mL | Freq: Once | INTRAVENOUS | Status: AC | PRN
  Administered 2023-08-11: 75 mL via INTRAVENOUS

## 2023-08-11 MED ORDER — ONDANSETRON HCL 4 MG/2ML IJ SOLN
4.0000 mg | Freq: Once | INTRAMUSCULAR | Status: AC
  Administered 2023-08-11: 4 mg via INTRAVENOUS
  Filled 2023-08-11: qty 2

## 2023-08-11 MED ORDER — SODIUM CHLORIDE 0.9 % IV BOLUS
1000.0000 mL | Freq: Once | INTRAVENOUS | Status: AC
  Administered 2023-08-11: 1000 mL via INTRAVENOUS

## 2023-08-11 MED ORDER — FENTANYL CITRATE PF 50 MCG/ML IJ SOSY
50.0000 ug | PREFILLED_SYRINGE | Freq: Once | INTRAMUSCULAR | Status: AC
  Administered 2023-08-11: 50 ug via INTRAVENOUS
  Filled 2023-08-11: qty 1

## 2023-08-11 NOTE — ED Provider Notes (Signed)
 [facility] [facility] Provider Note   CSN: [id number] Arrival date & time: 08/11/23  7322     History  Chief Complaint  Patient presents with   Abdominal Pain   Nausea    Krista Bailey is a 23 y.o. female.  23 year old female with no past medical history presents to the ED with a chief complaint of abdominal pain, nausea, vomiting which has been ongoing for the past 5 days.  Patient had a similar presentation in the past, states that she is unable to tolerate any p.o. intake.  There is sharp pain along the lower abdomen exacerbated with eating.  She was eating in triage when she arrived.  She does endorse marijuana use, but has not smoked in a couple days as she felt like she was very sick.  She did complete a round of Macrobid , no longer has any urinary symptoms at this time.  No fever, no vaginal discharge, no other complaints reported.  The history is provided by the patient.  Abdominal Pain Associated symptoms: nausea and vomiting   Associated symptoms: no chest pain, no chills and no fever        Home Medications Prior to Admission medications   Medication Sig Start Date End Date Taking? Authorizing Provider  acetaminophen  (TYLENOL ) 325 MG tablet Take 2 tablets (650 mg total) by mouth every 6 (six) hours as needed. 01/10/22   Teddi Favors, DO  famotidine  (PEPCID ) 40 MG tablet Take 1 tablet (40 mg total) by mouth daily. 08/25/22   Buena Carmine, NP  ibuprofen  (ADVIL ) 600 MG tablet Take 1 tablet (600 mg total) by mouth every 6 (six) hours as needed. 01/10/22   Teddi Favors, DO  methocarbamol  (ROBAXIN ) 500 MG tablet Take 1 tablet (500 mg total) by mouth 2 (two) times daily. 07/31/22   Dorenda Gandy, MD  nitrofurantoin , macrocrystal-monohydrate, (MACROBID ) 100 MG capsule Take 1 capsule (100 mg total) by mouth 2 (two) times daily. 08/08/23   White, Elizabeth A, PA-C  ondansetron  (ZOFRAN -ODT) 4 MG disintegrating tablet Take 1 tablet (4 mg  total) by mouth every 8 (eight) hours as needed for nausea or vomiting. 08/08/23   Kreg Pesa, PA-C  promethazine  (PHENERGAN ) 25 MG tablet Take 1 tablet (25 mg total) by mouth every 6 (six) hours as needed for nausea or vomiting. 08/08/23   Kreg Pesa, PA-C      Allergies    Patient has no known allergies.    Review of Systems   Review of Systems  Constitutional:  Negative for chills and fever.  Cardiovascular:  Negative for chest pain.  Gastrointestinal:  Positive for abdominal pain, nausea and vomiting.  Musculoskeletal:  Negative for back pain.  All other systems reviewed and are negative.   Physical Exam Updated Vital Signs BP 114/74 (BP Location: Right Arm)   Pulse 98   Temp 98.6 F (37 C)   Resp 14   Ht 5\' 7"  (1.702 m)   Wt 54 kg   SpO2 99%   BMI 18.64 kg/m  Physical Exam Vitals and nursing note reviewed.  Constitutional:      Appearance: She is well-developed.  HENT:     Head: Normocephalic and atraumatic.  Cardiovascular:     Rate and Rhythm: Normal rate.  Pulmonary:     Effort: Pulmonary effort is normal.  Abdominal:     General: Abdomen is flat.     Palpations: Abdomen is soft.     Tenderness: There  is no right CVA tenderness or left CVA tenderness.  Skin:    General: Skin is dry.  Neurological:     Mental Status: She is alert.     ED Results / Procedures / Treatments   Labs (all labs ordered are listed, but only abnormal results are displayed) Labs Reviewed  COMPREHENSIVE METABOLIC PANEL WITH GFR - Abnormal; Notable for the following components:      Result Value   BUN 5 (*)    Calcium 10.8 (*)    Total Protein 5.7 (*)    Albumin 3.2 (*)    Anion gap 4 (*)    All other components within normal limits  URINALYSIS, ROUTINE W REFLEX MICROSCOPIC - Abnormal; Notable for the following components:   APPearance CLOUDY (*)    Specific Gravity, Urine >1.046 (*)    Leukocytes,Ua LARGE (*)    Bacteria, UA RARE (*)    All other components  within normal limits  RAPID URINE DRUG SCREEN, HOSP PERFORMED - Abnormal; Notable for the following components:   Tetrahydrocannabinol POSITIVE (*)    All other components within normal limits  URINE CULTURE  LIPASE, BLOOD  CBC  HCG, SERUM, QUALITATIVE    EKG None  Radiology CT ABDOMEN PELVIS W CONTRAST Result Date: 08/11/2023 CLINICAL DATA:  Abdominal pain. EXAM: CT ABDOMEN AND PELVIS WITH CONTRAST TECHNIQUE: Multidetector CT imaging of the abdomen and pelvis was performed using the standard protocol following bolus administration of intravenous contrast. RADIATION DOSE REDUCTION: This exam was performed according to the departmental dose-optimization program which includes automated exposure control, adjustment of the mA and/or kV according to patient size and/or use of iterative reconstruction technique. CONTRAST:  75mL OMNIPAQUE  IOHEXOL  350 MG/ML SOLN COMPARISON:  CT abdomen pelvis dated 12/21/2019. FINDINGS: Lower chest: The visualized lung bases are clear. No intra-abdominal free air or free fluid. Hepatobiliary: The liver is unremarkable. No biliary dilatation. The liver is unremarkable. Pancreas: Unremarkable. No pancreatic ductal dilatation or surrounding inflammatory changes. Spleen: Normal in size without focal abnormality. Adrenals/Urinary Tract: The adrenal glands are unremarkable. There is a 3 mm nonobstructing left renal interpolar calculus. No hydronephrosis. The right kidney is unremarkable. The visualized ureters and urinary bladder appear unremarkable. Stomach/Bowel: The stomach is distended with ingested content. There is no bowel obstruction. There is mild infiltration of the right paracolic fat of indeterminate etiology. The appendix is normal. Vascular/Lymphatic: The abdominal aorta and IVC are unremarkable. No portal venous gas. There is no adenopathy. Reproductive: The uterus is grossly unremarkable no suspicious adnexal masses. Other: None Musculoskeletal: No acute or  significant osseous findings. IMPRESSION: 1. Mild infiltration of the right paracolic fat of indeterminate etiology. No bowel obstruction. Normal appendix. 2. A 3 mm nonobstructing left renal interpolar calculus. No hydronephrosis. Electronically Signed   By: Angus Bark M.D.   On: 08/11/2023 12:49    Procedures Procedures    Medications Ordered in ED Medications  sodium chloride  0.9 % bolus 1,000 mL (1,000 mLs Intravenous New Bag/Given 08/11/23 1200)  ondansetron  (ZOFRAN ) injection 4 mg (4 mg Intravenous Given 08/11/23 1159)  fentaNYL (SUBLIMAZE) injection 50 mcg (50 mcg Intravenous Given 08/11/23 1159)  iohexol  (OMNIPAQUE ) 350 MG/ML injection 75 mL (75 mLs Intravenous Contrast Given 08/11/23 1230)    ED Course/ Medical Decision Making/ A&P Clinical Course as of 08/11/23 1520  Wed Aug 11, 2023  1504 Tetrahydrocannabinol(!): POSITIVE [JS]  1512 Leukocytes,Ua(!): LARGE [JS]  1512 WBC, UA: 21-50 [JS]    Clinical Course User Index [JS] Batool Majid, PA-C  Medical Decision Making Amount and/or Complexity of Data Reviewed Labs: ordered. Decision-making details documented in ED Course. Radiology: ordered.  Risk Prescription drug management.    This patient presents to the ED for concern of abdominal pain, this involves a number of treatment options, and is a complaint that carries with it a high risk of complications and morbidity.  The differential diagnosis includes infection, obstruction versus hyperemesis cannaboid.    Co morbidities: Discussed in HPI   Brief History:  See HPI.   EMR reviewed including pt PMHx, past surgical history and past visits to ER.   See HPI for more details   Lab Tests:  I ordered and independently interpreted labs.  The pertinent results include:    I personally reviewed all laboratory work and imaging. Metabolic panel without any acute abnormality specifically kidney function within normal limits and no  significant electrolyte abnormalities. CBC without leukocytosis or significant anemia.   Imaging Studies:  MPRESSION:  1. Mild infiltration of the right paracolic fat of indeterminate  etiology. No bowel obstruction. Normal appendix.  2. A 3 mm nonobstructing left renal interpolar calculus. No  hydronephrosis.   Medicines ordered:  I ordered medication including zofran , fentanyl  for symptomatic treatment Reevaluation of the patient after these medicines showed that the patient improved I have reviewed the patients home medicines and have made adjustments as needed  Reevaluation:  After the interventions noted above I re-evaluated patient and found that they have :improved  Social Determinants of Health:  The patient's social determinants of health were a factor in the care of this patient   Problem List / ED Course:  Patient presented to the ED with a chief complaint of abdominal pain, reports this has been ongoing for 2 to 3 weeks.  Previously seen at urgent care, diagnosed with a UTI, finished a full course of Macrobid .  She does not report any urinary symptoms on today's visit.  Feels some lower abdominal pain that can waxes and wanes, she has not tried taking any medication at home.  She was eating on arrival and reports the nausea and the vomiting have since stopped. On my evaluation her exam is nonfocal, appears to be benign.  Denies any urinary symptoms, denies any vaginal discharge, vaginal bleeding.  No concern for pregnancy and pregnancy test is negative from today's visit. Interpretation of her blood work reveals CBC without any leukocytosis, hemoglobin is within normal limits.  CMP with no electrolyte derangement, creatinine levels normal.  LFTs are within normal limits.  No focal pain along the right upper quadrant to suspect gallbladder etiology.  Pregnancy test is negative, no concern for ectopic.  UDS is positive for THC, we discussed cessation.  Urinalysis with large  leukocytes, rare bacteria, 21-50 white blood cell count and low squamous.  Patient has 1 day left of her Macrobid  that she was previously prescribed by urgent care.  As she is currently not having symptoms, we did discuss obtaining a urine culture, we will call her if her symptoms are not improving. Patient is agreeable of plan and treatment, return precautions discussed at length.   Dispostion:  After consideration of the diagnostic results and the patients response to treatment, I feel that the patent would benefit from follow-up with gastroenterology.  Discontinue marijuana use.    Portions of this note were generated with Scientist, clinical (histocompatibility and immunogenetics). Dictation errors may occur despite best attempts at proofreading.   Final Clinical Impression(s) / ED Diagnoses Final diagnoses:  Generalized abdominal pain  Rx / DC Orders ED Discharge Orders     None         Luellen Sages, PA-C 08/11/23 1520    Rosealee Concha, MD 08/11/23 1930

## 2023-08-11 NOTE — Discharge Instructions (Signed)
 We will hold off on treating your urinary tract infection as you do not have any symptoms.

## 2023-08-11 NOTE — ED Triage Notes (Signed)
 Pt here for abd pain and nausea that started 5 days ago. C/O diarrhea. Pt eating in triage. Denies fevers.

## 2023-08-12 ENCOUNTER — Encounter: Payer: Self-pay | Admitting: Nurse Practitioner

## 2023-08-12 ENCOUNTER — Ambulatory Visit (INDEPENDENT_AMBULATORY_CARE_PROVIDER_SITE_OTHER): Admitting: Nurse Practitioner

## 2023-08-12 VITALS — BP 105/61 | HR 61 | Temp 98.2°F | Wt 123.2 lb

## 2023-08-12 DIAGNOSIS — N200 Calculus of kidney: Secondary | ICD-10-CM | POA: Diagnosis not present

## 2023-08-12 DIAGNOSIS — R1030 Lower abdominal pain, unspecified: Secondary | ICD-10-CM | POA: Diagnosis not present

## 2023-08-12 NOTE — Patient Instructions (Signed)
 1. Kidney stone (Primary)  - Ambulatory referral to Urology  2. Lower abdominal pain  - Ambulatory referral to Gastroenterology - Ambulatory referral to Urology

## 2023-08-12 NOTE — Progress Notes (Signed)
 Subjective   Patient ID: Krista Bailey, female    DOB: June 10, 2000, 22 y.o.   MRN: [id number]  Chief Complaint  Patient presents with   Establish Care    Abdomen pain x 4 weeks    Referring provider: No ref. provider found  Krista Bailey is a 23 y.o. female with No past medical history on file.   HPI  Patient today to establish care.  She went to the emergency room last night with abdominal pain.  She states that she has been having lower abdominal pain for 3 weeks.  She did test positive for UTI.  She was started on nitrofurantoin .  Patient is already currently on Flagyl for BV as well.  CT did show kidney stone.  We will place a referral to urology.  Will also refer patient to GI. Denies f/c/s, n/v/d, hemoptysis, PND, leg swelling Denies chest pain or edema           No Known Allergies   There is no immunization history on file for this patient.  Tobacco History: Social History   Tobacco Use  Smoking Status Never   Passive exposure: Yes  Smokeless Tobacco Never   Counseling given: Not Answered   Outpatient Encounter Medications as of 08/12/2023  Medication Sig   acetaminophen  (TYLENOL ) 325 MG tablet Take 2 tablets (650 mg total) by mouth every 6 (six) hours as needed.   ibuprofen  (ADVIL ) 600 MG tablet Take 1 tablet (600 mg total) by mouth every 6 (six) hours as needed.   promethazine  (PHENERGAN ) 25 MG tablet Take 1 tablet (25 mg total) by mouth every 6 (six) hours as needed for nausea or vomiting.   famotidine  (PEPCID ) 40 MG tablet Take 1 tablet (40 mg total) by mouth daily. (Patient not taking: Reported on 08/12/2023)   methocarbamol  (ROBAXIN ) 500 MG tablet Take 1 tablet (500 mg total) by mouth 2 (two) times daily. (Patient not taking: Reported on 08/12/2023)   nitrofurantoin , macrocrystal-monohydrate, (MACROBID ) 100 MG capsule Take 1 capsule (100 mg total) by mouth 2 (two) times daily. (Patient not taking: Reported on 08/12/2023)   ondansetron  (ZOFRAN -ODT) 4 MG  disintegrating tablet Take 1 tablet (4 mg total) by mouth every 8 (eight) hours as needed for nausea or vomiting. (Patient not taking: Reported on 08/12/2023)   No facility-administered encounter medications on file as of 08/12/2023.    Review of Systems  Review of Systems  Constitutional: Negative.   HENT: Negative.    Cardiovascular: Negative.   Gastrointestinal: Negative.   Allergic/Immunologic: Negative.   Neurological: Negative.   Psychiatric/Behavioral: Negative.       Objective:   BP 105/61   Pulse 61   Temp 98.2 F (36.8 C) (Oral)   Wt 123 lb 3.2 oz (55.9 kg)   SpO2 100%   BMI 19.30 kg/m   Wt Readings from Last 5 Encounters:  08/12/23 123 lb 3.2 oz (55.9 kg)  08/11/23 119 lb (54 kg)  07/31/22 118 lb (53.5 kg) (30%, Z= -0.52)*  04/05/20 117 lb (53.1 kg) (39%, Z= -0.29)*  01/16/20 118 lb 4.8 oz (53.7 kg) (43%, Z= -0.19)*   * Growth percentiles are based on CDC (Girls, 2-20 Years) data.     Physical Exam Vitals and nursing note reviewed.  Constitutional:      General: She is not in acute distress.    Appearance: She is well-developed.  Cardiovascular:     Rate and Rhythm: Normal rate and regular rhythm.  Pulmonary:     Effort: Pulmonary  effort is normal.     Breath sounds: Normal breath sounds.  Neurological:     Mental Status: She is alert and oriented to person, place, and time.       Assessment & Plan:   Kidney stone -     Ambulatory referral to Urology  Lower abdominal pain -     Ambulatory referral to Gastroenterology -     Ambulatory referral to Urology     Return in about 6 months (around 02/12/2024).   Jerrlyn Morel, NP 08/12/2023

## 2023-08-15 NOTE — Progress Notes (Signed)
 Brigitte Canard, PA-C [address] [place], [place]  [address] Phone: [phone]   Gastroenterology Consultation  Referring Provider:     Jerrlyn Morel, NP Primary Care Physician:  Pcp, No Primary Gastroenterologist:  Brigitte Canard, PA-C / Darol Elizabeth, MD  Reason for Consultation:     Abdominal pain, nausea, and vomiting        HPI:   Krista Bailey is a 23 y.o. y/o female referred for consultation & management  by Pcp, No.    Current symptoms: Patient has generalized chronic abdominal pain for many months.  Worse in the left lower quadrant for 4 weeks.  She has episodes of diarrhea alternating with constipation.  Episodes of nausea and vomiting.  She denies melena, hematochezia, heartburn, or dysphagia.  Weight is up and down.  Has history of chronic marijuana use for many years.  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  Recently treated for UTI and BV.  No previous EGD, colonoscopy, or GI evaluation.  08/11/2023 CT abdomen pelvis with contrast:  1. Mild infiltration of the right paracolic fat of indeterminate etiology. No bowel obstruction. Normal appendix. 2. A 3 mm nonobstructing left renal interpolar calculus. No hydronephrosis.  08/11/2023 labs: Positive THC.  Normal CBC (WBC 4.0, Hgb 13.2).  Normal lipase.  Negative pregnancy.  Normal LFTs.  Positive UTI treated with Macrobid .  PMH: Cannabis use disorder, depression, GERD.  History reviewed. No pertinent past medical history.  Past Surgical History:  Procedure Laterality Date   WISDOM TOOTH EXTRACTION      Prior to Admission medications   Medication Sig Start Date End Date Taking? Authorizing Provider  acetaminophen  (TYLENOL ) 325 MG tablet Take 2 tablets (650 mg total) by mouth every 6 (six) hours as needed. 01/10/22   Teddi Favors, DO  famotidine  (PEPCID ) 40 MG tablet Take 1 tablet (40 mg total) by mouth daily. Patient not taking: Reported on 08/12/2023 08/25/22   Buena Carmine,  NP  ibuprofen  (ADVIL ) 600 MG tablet Take 1 tablet (600 mg total) by mouth every 6 (six) hours as needed. 01/10/22   Teddi Favors, DO  methocarbamol  (ROBAXIN ) 500 MG tablet Take 1 tablet (500 mg total) by mouth 2 (two) times daily. Patient not taking: Reported on 08/12/2023 07/31/22   Dorenda Gandy, MD  nitrofurantoin , macrocrystal-monohydrate, (MACROBID ) 100 MG capsule Take 1 capsule (100 mg total) by mouth 2 (two) times daily. Patient not taking: Reported on 08/12/2023 08/08/23   Kreg Pesa, PA-C  ondansetron  (ZOFRAN -ODT) 4 MG disintegrating tablet Take 1 tablet (4 mg total) by mouth every 8 (eight) hours as needed for nausea or vomiting. Patient not taking: Reported on 08/12/2023 08/08/23   Kreg Pesa, PA-C  promethazine  (PHENERGAN ) 25 MG tablet Take 1 tablet (25 mg total) by mouth every 6 (six) hours as needed for nausea or vomiting. 08/08/23   Kreg Pesa, PA-C    Family History  Problem Relation Age of Onset   Healthy Mother    Healthy Father    Liver disease Neg Hx    Esophageal cancer Neg Hx    Colon cancer Neg Hx      Social History   Tobacco Use   Smoking status: Never    Passive exposure: Yes   Smokeless tobacco: Never  Vaping Use   Vaping status: Every Day  Substance Use Topics   Alcohol use: Never   Drug use: Not Currently    Types: Marijuana  Allergies as of 08/16/2023   (No Known Allergies)    Review of Systems:    All systems reviewed and negative except where noted in HPI.   Physical Exam:  BP 120/68   Pulse (!) 51   Ht 5\' 7"  (1.702 m)   Wt 120 lb (54.4 kg)   BMI 18.79 kg/m  No LMP recorded. Patient has had an injection.  General:   Alert,  Well-developed, well-nourished, pleasant and cooperative in NAD Lungs:  Respirations even and unlabored.  Clear throughout to auscultation.   No wheezes, crackles, or rhonchi. No acute distress. Heart:  Regular rate and rhythm; no murmurs, clicks, rubs, or gallops. Abdomen:  Normal bowel  sounds.  No bruits.  Soft, and non-distended without masses, hepatosplenomegaly or hernias noted.  Mild LLQ and RLQ Tenderness.  No upper abdominal tenderness.  No guarding or rebound tenderness.    Neurologic:  Alert and oriented x3;  grossly normal neurologically. Psych:  Alert and cooperative. Normal mood and affect.  Imaging Studies: CT ABDOMEN PELVIS W CONTRAST Result Date: 08/11/2023 CLINICAL DATA:  Abdominal pain. EXAM: CT ABDOMEN AND PELVIS WITH CONTRAST TECHNIQUE: Multidetector CT imaging of the abdomen and pelvis was performed using the standard protocol following bolus administration of intravenous contrast. RADIATION DOSE REDUCTION: This exam was performed according to the departmental dose-optimization program which includes automated exposure control, adjustment of the mA and/or kV according to patient size and/or use of iterative reconstruction technique. CONTRAST:  75mL OMNIPAQUE  IOHEXOL  350 MG/ML SOLN COMPARISON:  CT abdomen pelvis dated 12/21/2019. FINDINGS: Lower chest: The visualized lung bases are clear. No intra-abdominal free air or free fluid. Hepatobiliary: The liver is unremarkable. No biliary dilatation. The liver is unremarkable. Pancreas: Unremarkable. No pancreatic ductal dilatation or surrounding inflammatory changes. Spleen: Normal in size without focal abnormality. Adrenals/Urinary Tract: The adrenal glands are unremarkable. There is a 3 mm nonobstructing left renal interpolar calculus. No hydronephrosis. The right kidney is unremarkable. The visualized ureters and urinary bladder appear unremarkable. Stomach/Bowel: The stomach is distended with ingested content. There is no bowel obstruction. There is mild infiltration of the right paracolic fat of indeterminate etiology. The appendix is normal. Vascular/Lymphatic: The abdominal aorta and IVC are unremarkable. No portal venous gas. There is no adenopathy. Reproductive: The uterus is grossly unremarkable no suspicious adnexal  masses. Other: None Musculoskeletal: No acute or significant osseous findings. IMPRESSION: 1. Mild infiltration of the right paracolic fat of indeterminate etiology. No bowel obstruction. Normal appendix. 2. A 3 mm nonobstructing left renal interpolar calculus. No hydronephrosis. Electronically Signed   By: Angus Bark M.D.   On: 08/11/2023 12:49    Labs: CBC    Component Value Date/Time   WBC 4.0 08/11/2023 0905   RBC 4.63 08/11/2023 0905   HGB 13.2 08/11/2023 0905   HCT 41.8 08/11/2023 0905   PLT 223 08/11/2023 0905   MCV 90.3 08/11/2023 0905   MCH 28.5 08/11/2023 0905   MCHC 31.6 08/11/2023 0905   RDW 13.0 08/11/2023 0905   LYMPHSABS 1.4 12/21/2019 1600   MONOABS 0.8 12/21/2019 1600   EOSABS 0.0 12/21/2019 1600   BASOSABS 0.0 12/21/2019 1600    CMP     Component Value Date/Time   NA 139 08/11/2023 0905   K 4.0 08/11/2023 0905   CL 111 08/11/2023 0905   CO2 24 08/11/2023 0905   GLUCOSE 72 08/11/2023 0905   BUN 5 (L) 08/11/2023 0905   CREATININE 0.91 08/11/2023 0905   CALCIUM 10.8 (H) 08/11/2023 4696  PROT 5.7 (L) 08/11/2023 0905   ALBUMIN 3.2 (L) 08/11/2023 0905   AST 20 08/11/2023 0905   ALT 13 08/11/2023 0905   ALKPHOS 57 08/11/2023 0905   BILITOT 0.6 08/11/2023 0905   GFRNONAA >60 08/11/2023 0905   GFRAA NOT CALCULATED 12/21/2019 1600    Assessment and Plan:   Krista Bailey is a 23 y.o. y/o female has been referred for:  1.  Lower Abdominal pain / Cramping; history of recent UTI treated with Macrobid ; negative pregnancy test.  Negative abdominal pelvic CT. -Rx Dicyclomine  10mg  TID as needed for abdominal cramping, #90, 2 refills.  2.  Nausea and vomiting; suspicious for hyperemesis cannabis syndrome - H. Pylori Stool test - Rx Pantoprazole  40mg  1 tablet once daily, #30, 2 refills. - Strongly recommended cessation of all marijuana and cannabis products. - If symptoms persist, EGD is the next step.  3.  Cannabis use disorder -Avoid Marijuana  4.   Bowel Irregularities - Abdominal Xray - Evaluate stool burden; Evaluate for constipation  Follow up in 4 weeks with TG.  Brigitte Canard, PA-C

## 2023-08-16 ENCOUNTER — Encounter: Payer: Self-pay | Admitting: Physician Assistant

## 2023-08-16 ENCOUNTER — Ambulatory Visit (INDEPENDENT_AMBULATORY_CARE_PROVIDER_SITE_OTHER)
Admission: RE | Admit: 2023-08-16 | Discharge: 2023-08-16 | Disposition: A | Discharge status: Home / Self Care | Source: Ambulatory Visit | Attending: Physician Assistant | Admitting: Physician Assistant

## 2023-08-16 ENCOUNTER — Ambulatory Visit: Admitting: Physician Assistant

## 2023-08-16 VITALS — BP 120/68 | HR 51 | Ht 67.0 in | Wt 120.0 lb

## 2023-08-16 DIAGNOSIS — R112 Nausea with vomiting, unspecified: Secondary | ICD-10-CM | POA: Diagnosis not present

## 2023-08-16 DIAGNOSIS — R1084 Generalized abdominal pain: Secondary | ICD-10-CM | POA: Diagnosis not present

## 2023-08-16 DIAGNOSIS — R197 Diarrhea, unspecified: Secondary | ICD-10-CM

## 2023-08-16 DIAGNOSIS — F1210 Cannabis abuse, uncomplicated: Secondary | ICD-10-CM | POA: Diagnosis not present

## 2023-08-16 DIAGNOSIS — R1030 Lower abdominal pain, unspecified: Secondary | ICD-10-CM

## 2023-08-16 DIAGNOSIS — K5901 Slow transit constipation: Secondary | ICD-10-CM

## 2023-08-16 DIAGNOSIS — R194 Change in bowel habit: Secondary | ICD-10-CM | POA: Diagnosis not present

## 2023-08-16 DIAGNOSIS — R1110 Vomiting, unspecified: Secondary | ICD-10-CM | POA: Diagnosis not present

## 2023-08-16 DIAGNOSIS — K3189 Other diseases of stomach and duodenum: Secondary | ICD-10-CM | POA: Diagnosis not present

## 2023-08-16 DIAGNOSIS — N200 Calculus of kidney: Secondary | ICD-10-CM | POA: Diagnosis not present

## 2023-08-16 MED ORDER — DICYCLOMINE HCL 10 MG PO CAPS
10.0000 mg | ORAL_CAPSULE | Freq: Three times a day (TID) | ORAL | 2 refills | Status: AC
Start: 2023-08-16 — End: 2023-11-14

## 2023-08-16 MED ORDER — PANTOPRAZOLE SODIUM 40 MG PO TBEC
40.0000 mg | DELAYED_RELEASE_TABLET | Freq: Every day | ORAL | 2 refills | Status: AC
Start: 2023-08-16 — End: 2023-11-14

## 2023-08-16 NOTE — Patient Instructions (Signed)
 We have sent the following medications to your pharmacy for you to pick up at your convenience: Dicyclomine  10 mg three times daily and Pantoprazole  40 mg daily  Your provider has requested that you have an abdominal x ray before leaving today. Please go to the basement floor to our Radiology department for the test.  Your provider has requested that you go to the basement level for lab work before leaving today. Press "B" on the elevator. The lab is located at the first door on the left as you exit the elevator.  _______________________________________________________  If your blood pressure at your visit was 140/90 or greater, please contact your primary care physician to follow up on this.  _______________________________________________________  If you are age 49 or older, your body mass index should be between 23-30. Your Body mass index is 18.79 kg/m. If this is out of the aforementioned range listed, please consider follow up with your Primary Care Provider.  If you are age 68 or younger, your body mass index should be between 19-25. Your Body mass index is 18.79 kg/m. If this is out of the aformentioned range listed, please consider follow up with your Primary Care Provider.   ________________________________________________________  The [facility] providers would like to encourage you to use MYCHART to communicate with providers for non-urgent requests or questions.  Due to long hold times on the telephone, sending your provider a message by [facility] may be a faster and more efficient way to get a response.  Please allow 48 business hours for a response.  Please remember that this is for non-urgent requests.  _______________________________________________________  Thank you for entrusting me with your care and choosing [facility].  Brigitte Canard, PA-C

## 2023-08-17 ENCOUNTER — Other Ambulatory Visit (HOSPITAL_COMMUNITY): Payer: Self-pay

## 2023-08-17 ENCOUNTER — Telehealth: Payer: Self-pay

## 2023-08-17 NOTE — Telephone Encounter (Signed)
 Pharmacy Patient Advocate Encounter   Received notification from CoverMyMeds that prior authorization for Dicyclomine  HCl 10MG  capsules is required/requested.   Insurance verification completed.   The patient is insured through [facility] .   Per test claim: The current 30 day co-pay is, $7.85.  No PA needed at this time. This test claim was processed through [facility]- copay amounts may vary at other pharmacies due to pharmacy/plan contracts, or as the patient moves through the different stages of their insurance plan.

## 2023-08-24 ENCOUNTER — Ambulatory Visit: Payer: Self-pay | Admitting: Physician Assistant

## 2023-08-29 NOTE — Progress Notes (Signed)
 Agree with the assessment and plan as outlined by Brigitte Canard, PA-C.

## 2023-09-03 ENCOUNTER — Encounter (HOSPITAL_COMMUNITY): Payer: Self-pay

## 2023-09-03 ENCOUNTER — Ambulatory Visit (HOSPITAL_COMMUNITY)
Admission: EM | Admit: 2023-09-03 | Discharge: 2023-09-03 | Disposition: A | Discharge status: Home / Self Care | Attending: Emergency Medicine | Admitting: Emergency Medicine

## 2023-09-03 DIAGNOSIS — K529 Noninfective gastroenteritis and colitis, unspecified: Secondary | ICD-10-CM

## 2023-09-03 DIAGNOSIS — R519 Headache, unspecified: Secondary | ICD-10-CM | POA: Diagnosis not present

## 2023-09-03 MED ORDER — METOCLOPRAMIDE HCL 5 MG/ML IJ SOLN
5.0000 mg | Freq: Once | INTRAMUSCULAR | Status: AC
  Administered 2023-09-03: 5 mg via INTRAMUSCULAR

## 2023-09-03 MED ORDER — KETOROLAC TROMETHAMINE 30 MG/ML IJ SOLN
30.0000 mg | Freq: Once | INTRAMUSCULAR | Status: AC
  Administered 2023-09-03: 30 mg via INTRAMUSCULAR

## 2023-09-03 MED ORDER — ONDANSETRON 4 MG PO TBDP
4.0000 mg | ORAL_TABLET | Freq: Three times a day (TID) | ORAL | 0 refills | Status: AC | PRN
Start: 2023-09-03 — End: ?

## 2023-09-03 MED ORDER — METOCLOPRAMIDE HCL 5 MG/ML IJ SOLN
INTRAMUSCULAR | Status: AC
  Filled 2023-09-03: qty 2

## 2023-09-03 MED ORDER — DEXAMETHASONE SODIUM PHOSPHATE 10 MG/ML IJ SOLN
INTRAMUSCULAR | Status: AC
  Filled 2023-09-03: qty 1

## 2023-09-03 MED ORDER — KETOROLAC TROMETHAMINE 30 MG/ML IJ SOLN
INTRAMUSCULAR | Status: AC
  Filled 2023-09-03: qty 1

## 2023-09-03 MED ORDER — DEXAMETHASONE SODIUM PHOSPHATE 10 MG/ML IJ SOLN
10.0000 mg | Freq: Once | INTRAMUSCULAR | Status: AC
  Administered 2023-09-03: 10 mg via INTRAMUSCULAR

## 2023-09-03 NOTE — ED Triage Notes (Addendum)
 Patient reports that she has had nausea x 3 days and vomited x 2 today. Patient added that she also has a migraine and has light sensitivity.  Patient states she took an Ibuprofen  for a migraine that she also has.

## 2023-09-03 NOTE — ED Provider Notes (Signed)
 [facility]    CSN: [id number] Arrival date & time: 09/03/23  1555      History   Chief Complaint Chief Complaint  Patient presents with   Emesis   Migraine    HPI Krista Bailey is a 23 y.o. female.   Patient presents with nausea x 3 days.  Patient states that today she began vomiting and had 2 episodes of vomiting prior to arrival.  Patient states that she also began to have a bad headache today with photophobia.  Patient states that she did try to take some ibuprofen  but threw this up.  Patient reports having last BM was yesterday and this was normal.  Denies abdominal pain, diarrhea, blood in vomit or stool, cough, congestion, sore throat, and known fever.  Denies numbness, tingling, weakness, blurred vision, confusion, speech, chest pain, and shortness of breath.  Patient is unsure of LMP, but receives Depo injections.  Patient states that her last Depo injection was in April.  The history is provided by the patient and medical records.  Emesis Migraine    No past medical history on file.  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      Home Medications    Prior to Admission medications   Medication Sig Start Date End Date Taking? Authorizing Provider  ondansetron  (ZOFRAN -ODT) 4 MG disintegrating tablet Take 1 tablet (4 mg total) by mouth every 8 (eight) hours as needed for nausea or vomiting. 09/03/23  Yes Rosevelt Constable, Shawnia Vizcarrondo A, NP  acetaminophen  (TYLENOL ) 325 MG tablet Take 2 tablets (650 mg total) by mouth every 6 (six) hours as needed. 01/10/22   Teddi Favors, DO  dicyclomine   (BENTYL ) 10 MG capsule Take 1 capsule (10 mg total) by mouth 3 (three) times daily before meals. 08/16/23 11/14/23  Brigitte Canard, PA-C  famotidine  (PEPCID ) 40 MG tablet Take 1 tablet (40 mg total) by mouth daily. 08/25/22   Buena Carmine, NP  ondansetron  (ZOFRAN -ODT) 4 MG disintegrating tablet Take 1 tablet (4 mg total) by mouth every 8 (eight) hours as needed for nausea or vomiting. 08/08/23   Kreg Pesa, PA-C  pantoprazole  (PROTONIX ) 40 MG tablet Take 1 tablet (40 mg total) by mouth daily. 08/16/23 11/14/23  Brigitte Canard, PA-C  promethazine  (PHENERGAN ) 25 MG tablet Take 1 tablet (25 mg total) by mouth every 6 (six) hours as needed for nausea or vomiting. 08/08/23   Kreg Pesa, PA-C    Family History Family History  Problem Relation Age of Onset   Healthy Mother    Healthy Father    Liver disease Neg Hx    Esophageal cancer Neg Hx    Colon cancer Neg Hx     Social History Social History   Tobacco Use   Smoking status: Never    Passive exposure: Yes   Smokeless tobacco: Never  Vaping Use   Vaping status: Every Day   Substances: Nicotine  Substance Use Topics   Alcohol use: Never   Drug use: Yes    Types: Marijuana     Allergies   Patient has no known allergies.  Review of Systems Review of Systems  Gastrointestinal:  Positive for vomiting.   Per HPI  Physical Exam Triage Vital Signs ED Triage Vitals  Encounter Vitals Group     BP 09/03/23 1638 125/84     Systolic BP Percentile --      Diastolic BP Percentile --      Pulse Rate 09/03/23 1638 69     Resp 09/03/23 1638 16     Temp 09/03/23 1638 100.2 F (37.9 C)     Temp Source 09/03/23 1638 Oral     SpO2 09/03/23 1638 96 %     Weight --      Height --      Head Circumference --      Peak Flow --      Pain Score 09/03/23 1609 10     Pain Loc --      Pain Education --      Exclude from Growth Chart --    No data found.  Updated Vital Signs BP 125/84 (BP Location: Left Arm)   Pulse 69    Temp 100.2 F (37.9 C) (Oral)   Resp 16   SpO2 96%   Visual Acuity Right Eye Distance:   Left Eye Distance:   Bilateral Distance:    Right Eye Near:   Left Eye Near:    Bilateral Near:     Physical Exam Vitals and nursing note reviewed.  Constitutional:      General: She is awake. She is not in acute distress.    Appearance: Normal appearance. She is well-developed and well-groomed. She is ill-appearing.  HENT:     Right Ear: Tympanic membrane, ear canal and external ear normal.     Left Ear: Tympanic membrane, ear canal and external ear normal.     Nose: Nose normal.     Mouth/Throat:     Mouth: Mucous membranes are moist.     Pharynx: Oropharynx is clear.  Cardiovascular:     Rate and Rhythm: Normal rate and regular rhythm.  Pulmonary:     Effort: Pulmonary effort is normal.     Breath sounds: Normal breath sounds.  Abdominal:     General: Abdomen is flat. Bowel sounds are normal. There is no distension.     Palpations: Abdomen is soft. There is no mass.     Tenderness: There is abdominal tenderness in the epigastric area and periumbilical area. There is no guarding or rebound.  Skin:    General: Skin is warm and dry.  Neurological:     Mental Status: She is alert and oriented to person, place, and time. Mental status is at baseline.     GCS: GCS eye subscore is 4. GCS verbal subscore is 5. GCS motor subscore is 6.     Cranial Nerves: Cranial nerves 2-12 are intact.     Sensory: Sensation is intact.     Motor: Motor function is intact.     Coordination: Coordination is intact.     Gait: Gait is intact.  Psychiatric:        Behavior: Behavior is cooperative.      UC Treatments / Results  Labs (all labs ordered are listed, but only abnormal results are displayed) Labs Reviewed - No data to display  EKG   Radiology No results found.  Procedures Procedures (including critical care time)  Medications Ordered in UC Medications  ketorolac  (TORADOL ) 30  MG/ML injection 30 mg (30 mg Intramuscular Given 09/03/23 1658)  metoCLOPramide (REGLAN)  injection 5 mg (5 mg Intramuscular Given 09/03/23 1659)  dexamethasone (DECADRON) injection 10 mg (10 mg Intramuscular Given 09/03/23 1657)    Initial Impression / Assessment and Plan / UC Course  I have reviewed the triage vital signs and the nursing notes.  Pertinent labs & imaging results that were available during my care of the patient were reviewed by me and considered in my medical decision making (see chart for details).     Patient is mildly ill-appearing.  Mild tenderness noted to epigastric and periumbilical region.  Without guarding or rebound tenderness.  No other significant findings upon exam.  Given IM Toradol , Decadron, and Reglan in clinic for bad headache.  Prescribe Zofran  as needed for any nausea or vomiting.  Discussed follow-up, return, and strict ER precautions Final Clinical Impressions(s) / UC Diagnoses   Final diagnoses:  Bad headache  Gastroenteritis     Discharge Instructions      I have sent some more Zofran  that you can take every 8 hours as needed for nausea and vomiting.  As discussed I would avoid taking this for at least 8 hours after receiving medications here in clinic today.    You were given a series of medications today to help with your headache: Toradol  which is an anti-inflammatory, Decadron which is a steroid to help with inflammation, and Reglan which is a strong nausea medication that works in combination with the 2 other medications to help relieve your headache.  If your headache persists, worsens, you develop worsening vision changes, severe dizziness, passing out, excessive vomiting, weakness, numbness, chest pain, confusion, and slurred speech please seek immediate medical treatment in the emergency department.   Otherwise keep your follow-up appointment with your gastroenterologist later this month.  Return here as needed.  ED Prescriptions      Medication Sig Dispense Auth. Provider   ondansetron  (ZOFRAN -ODT) 4 MG disintegrating tablet Take 1 tablet (4 mg total) by mouth every 8 (eight) hours as needed for nausea or vomiting. 10 tablet Levora Reas A, NP      PDMP not reviewed this encounter.   Levora Reas A, NP 09/03/23 [id number]

## 2023-09-03 NOTE — Discharge Instructions (Signed)
 I have sent some more Zofran  that you can take every 8 hours as needed for nausea and vomiting.  As discussed I would avoid taking this for at least 8 hours after receiving medications here in clinic today.    You were given a series of medications today to help with your headache: Toradol  which is an anti-inflammatory, Decadron which is a steroid to help with inflammation, and Reglan which is a strong nausea medication that works in combination with the 2 other medications to help relieve your headache.  If your headache persists, worsens, you develop worsening vision changes, severe dizziness, passing out, excessive vomiting, weakness, numbness, chest pain, confusion, and slurred speech please seek immediate medical treatment in the emergency department.   Otherwise keep your follow-up appointment with your gastroenterologist later this month.  Return here as needed.

## 2023-09-23 NOTE — Progress Notes (Deleted)
 Ellouise Console, PA-C [address] [place], [place]  [address] Phone: [phone]   Primary Care Physician: Pcp, No  Primary Gastroenterologist:  Ellouise Console, PA-C / ***  Chief Complaint: F/U nausea, vomiting, abdominal pain       HPI:   Krista Bailey is a 23 y.o. female returns for 1 month follow-up of abdominal pain, nausea, vomiting, constipation, and diarrhea.  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  At her last visit she was started on dicyclomine  10 Mg 3 times daily for abdominal cramping.  Also started pantoprazole  40 Mg once daily for GERD/gastritis.  Advised to avoid marijuana.  H. pylori stool test was ordered, but not completed.  08/16/2023 abdominal x-ray: Small volume of formed stool in the colon.  No obstruction.  Current symptoms:  No previous EGD, colonoscopy, or GI evaluation.   08/11/2023 CT abdomen pelvis with contrast:  1. Mild infiltration of the right paracolic fat of indeterminate etiology. No bowel obstruction. Normal appendix. 2. A 3 mm nonobstructing left renal interpolar calculus. No hydronephrosis.   08/11/2023 labs: Positive THC.  Normal CBC (WBC 4.0, Hgb 13.2).  Normal lipase.  Negative pregnancy.  Normal LFTs.  Positive UTI treated with Macrobid .   PMH: Cannabis use disorder, depression, GERD.  Current Outpatient Medications  Medication Sig Dispense Refill   acetaminophen  (TYLENOL ) 325 MG tablet Take 2 tablets (650 mg total) by mouth every 6 (six) hours as needed. 36 tablet 0   dicyclomine  (BENTYL ) 10 MG capsule Take 1 capsule (10 mg total) by mouth 3 (three) times daily before meals. 90 capsule 2   famotidine  (PEPCID ) 40 MG tablet Take 1 tablet (40 mg total) by mouth daily. 30 tablet 0   ondansetron  (ZOFRAN -ODT) 4 MG disintegrating tablet Take 1 tablet (4 mg total) by mouth every 8 (eight) hours as needed for nausea or vomiting. 20 tablet 0   ondansetron  (ZOFRAN -ODT) 4 MG disintegrating tablet Take 1 tablet (4 mg total) by mouth every  8 (eight) hours as needed for nausea or vomiting. 10 tablet 0   pantoprazole  (PROTONIX ) 40 MG tablet Take 1 tablet (40 mg total) by mouth daily. 30 tablet 2   promethazine  (PHENERGAN ) 25 MG tablet Take 1 tablet (25 mg total) by mouth every 6 (six) hours as needed for nausea or vomiting. 20 tablet 0   No current facility-administered medications for this visit.    Allergies as of 09/24/2023   (No Known Allergies)    No past medical history on file.  Past Surgical History:  Procedure Laterality Date   WISDOM TOOTH EXTRACTION      Review of Systems:    All systems reviewed and negative except where noted in HPI.    Physical Exam:  There were no vitals taken for this visit. No LMP recorded. Patient has had an injection.  General: Well-nourished, well-developed in no acute distress.  Lungs: Clear to auscultation bilaterally. Non-labored. Heart: Regular rate and rhythm, no murmurs rubs or gallops.  Abdomen: Bowel sounds are normal; Abdomen is Soft; No hepatosplenomegaly, masses or hernias;  No Abdominal Tenderness; No guarding or rebound tenderness. Neuro: Alert and oriented x 3.  Grossly intact.  Psych: Alert and cooperative, normal mood and affect.   Imaging Studies: No results found.  Labs: CBC    Component Value Date/Time   WBC 4.0 08/11/2023 0905   RBC 4.63 08/11/2023 0905   HGB 13.2 08/11/2023 0905   HCT 41.8 08/11/2023 0905   PLT 223 08/11/2023 0905   MCV  90.3 08/11/2023 0905   MCH 28.5 08/11/2023 0905   MCHC 31.6 08/11/2023 0905   RDW 13.0 08/11/2023 0905   LYMPHSABS 1.4 12/21/2019 1600   MONOABS 0.8 12/21/2019 1600   EOSABS 0.0 12/21/2019 1600   BASOSABS 0.0 12/21/2019 1600    CMP     Component Value Date/Time   NA 139 08/11/2023 0905   K 4.0 08/11/2023 0905   CL 111 08/11/2023 0905   CO2 24 08/11/2023 0905   GLUCOSE 72 08/11/2023 0905   BUN 5 (L) 08/11/2023 0905   CREATININE 0.91 08/11/2023 0905   CALCIUM 10.8 (H) 08/11/2023 0905   PROT 5.7 (L)  08/11/2023 0905   ALBUMIN 3.2 (L) 08/11/2023 0905   AST 20 08/11/2023 0905   ALT 13 08/11/2023 0905   ALKPHOS 57 08/11/2023 0905   BILITOT 0.6 08/11/2023 0905   GFRNONAA >60 08/11/2023 0905   GFRAA NOT CALCULATED 12/21/2019 1600       Assessment and Plan:   Krista Bailey is a 23 y.o. y/o female returns for follow-up of chronic lower abdominal cramping, nausea, vomiting, diarrhea, and constipation.  1 month ago she was started on dicyclomine  and pantoprazole .  Abdominal x-ray unrevealing.  Negative abdominal pelvic CT and labs.  Advised to avoid marijuana.  She did not complete H. pylori stool test.  Differential includes hyperemesis cannabis syndrome, irritable bowel syndrome, GERD and gastritis.    Ellouise Console, PA-C  Follow up ***

## 2023-09-24 ENCOUNTER — Ambulatory Visit: Admitting: Physician Assistant

## 2024-02-14 ENCOUNTER — Ambulatory Visit: Payer: Self-pay | Admitting: Nurse Practitioner
